# Patient Record
Sex: Female | Born: 1985 | Race: White | Hispanic: No | Marital: Single
Health system: Southern US, Community
[De-identification: ages and names within clinical notes are randomized; demographics above are authoritative.]

---

## 2008-10-25 ENCOUNTER — Ambulatory Visit: Payer: Self-pay | Admitting: Internal Medicine

## 2008-11-22 ENCOUNTER — Ambulatory Visit: Payer: Self-pay | Admitting: Internal Medicine

## 2009-03-29 ENCOUNTER — Emergency Department: Payer: Self-pay

## 2009-07-05 ENCOUNTER — Emergency Department: Payer: Self-pay | Admitting: Emergency Medicine
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
## 2010-03-17 ENCOUNTER — Ambulatory Visit: Payer: Self-pay | Admitting: Family Medicine

## 2010-08-21 ENCOUNTER — Ambulatory Visit: Payer: Self-pay | Admitting: Internal Medicine

## 2010-10-31 ENCOUNTER — Emergency Department: Payer: Self-pay | Admitting: Internal Medicine

## 2010-12-11 ENCOUNTER — Ambulatory Visit: Payer: Self-pay | Admitting: Internal Medicine

## 2011-12-25 ENCOUNTER — Ambulatory Visit: Payer: Self-pay | Admitting: Internal Medicine

## 2011-12-25 LAB — RAPID STREP-A WITH REFLX: Micro Text Report: NEGATIVE

## 2011-12-27 LAB — BETA STREP CULTURE(ARMC)

## 2012-02-06 ENCOUNTER — Ambulatory Visit: Payer: Self-pay | Admitting: Family Medicine

## 2012-03-31 ENCOUNTER — Emergency Department: Payer: Self-pay | Admitting: Emergency Medicine

## 2012-11-16 IMAGING — CR LEFT WRIST - COMPLETE 3+ VIEW
1 series · 4 of 4 positions shown · non-contrast
Comparison: none

REASON FOR EXAM: left wrist and thumb injury
COMMENTS:

PROCEDURE:     MDR - MDR WRIST LT COMP WITH OBLIQUES  - February 06, 2012  [DATE]
RESULT:     No fracture, dislocation or other acute bony abnormality is
identified.

[Series 1: pa · 0.17mm/px · 4 of 4 slices shown]
[im 1/4]
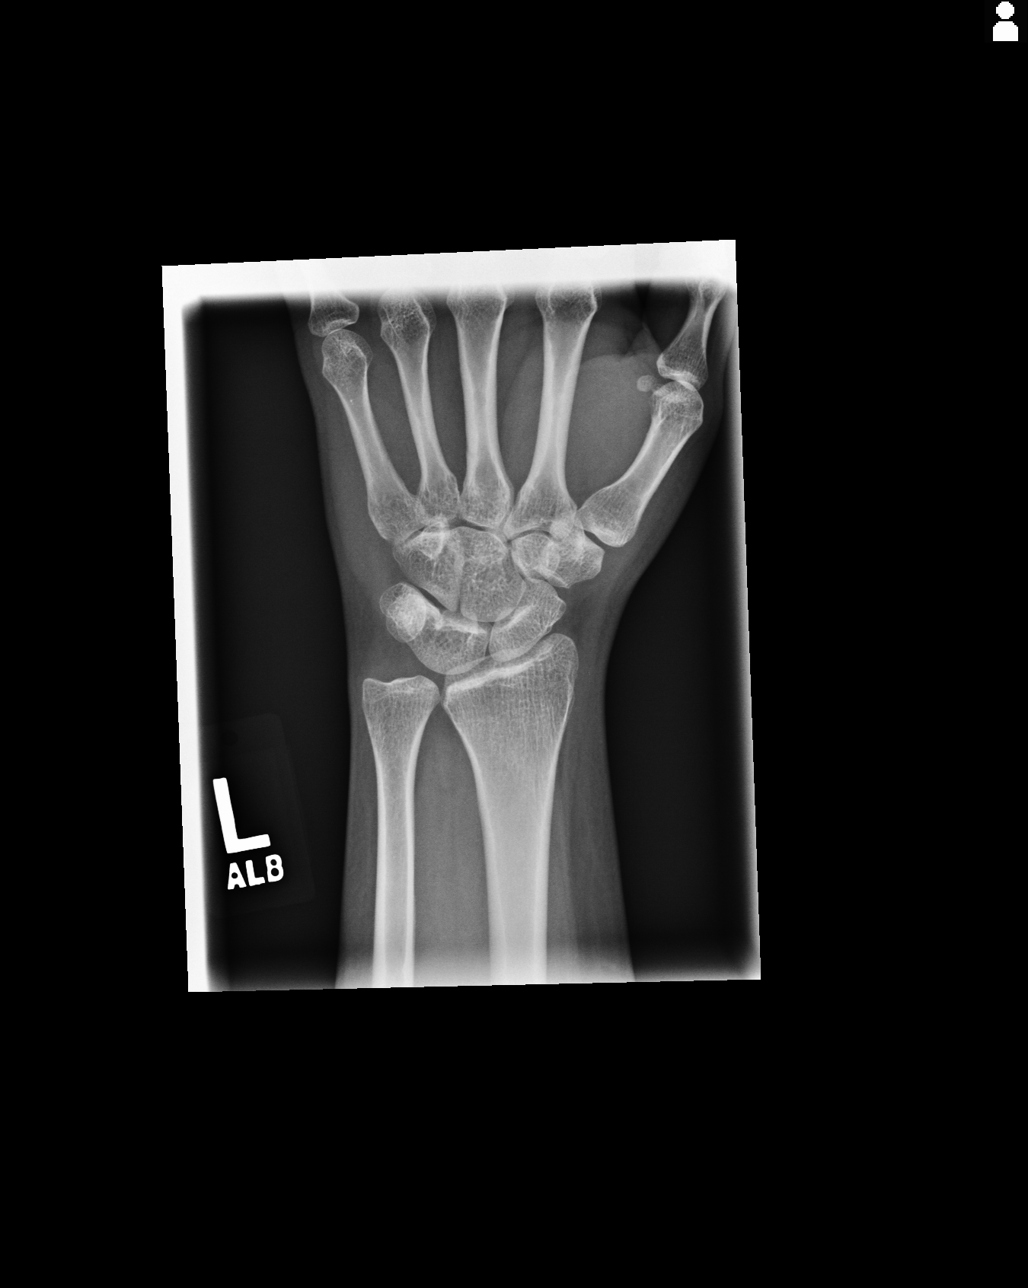
[im 2/4]
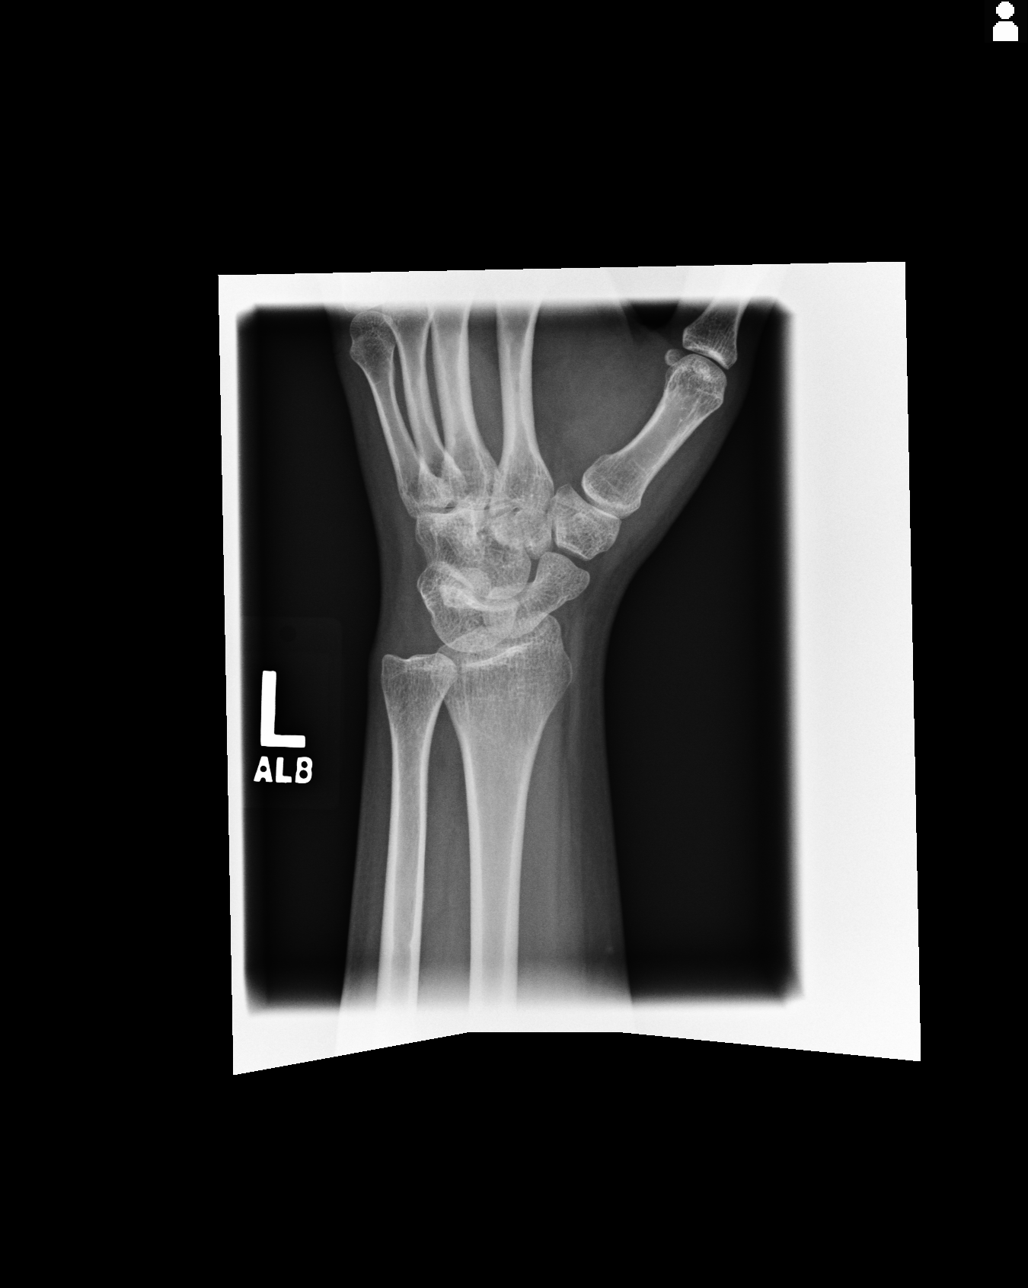
[im 3/4]
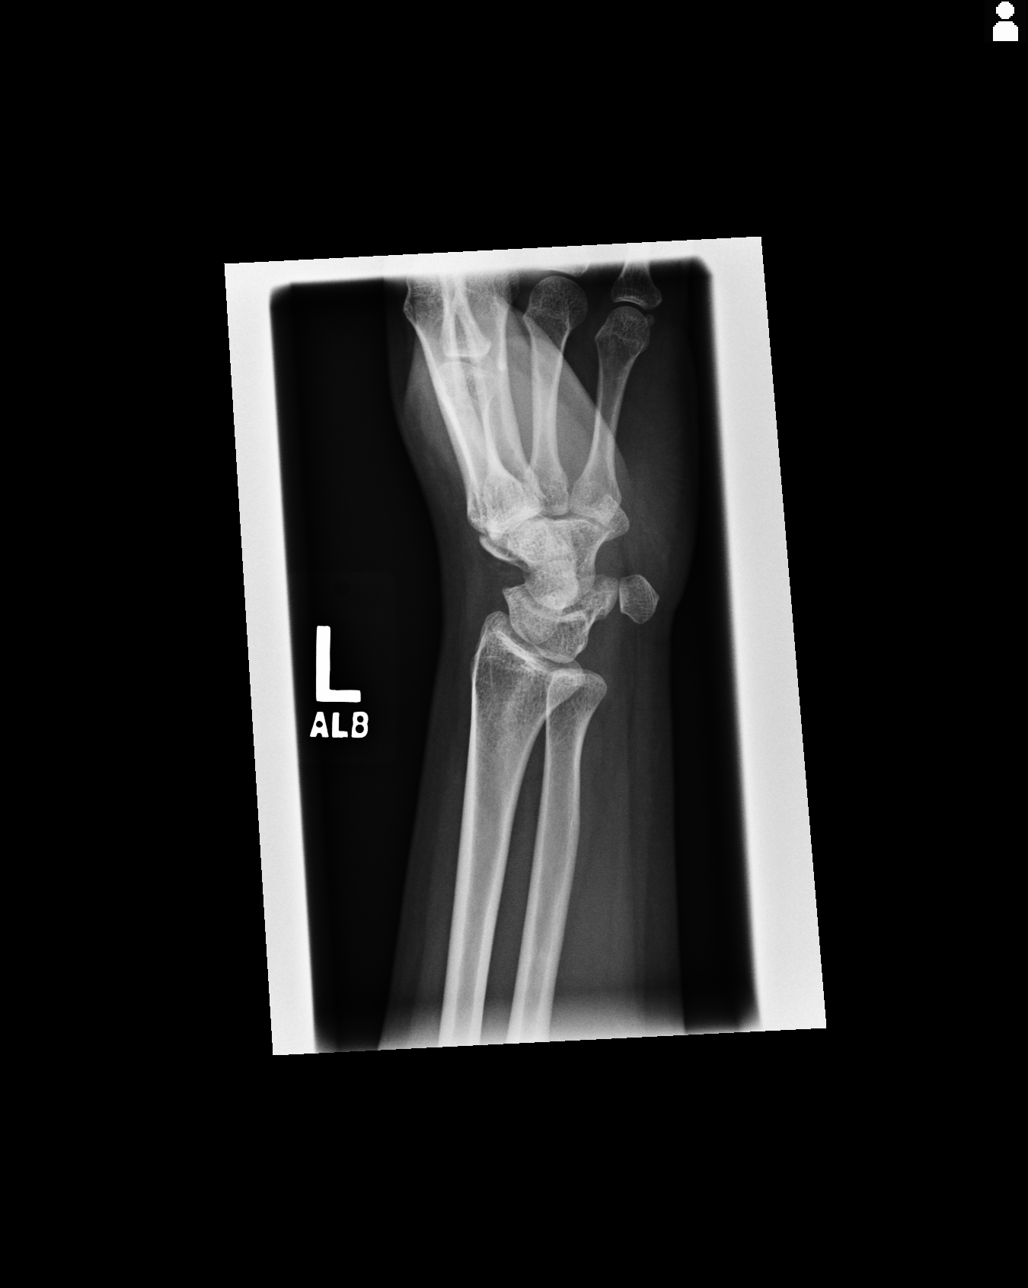
[im 4/4]
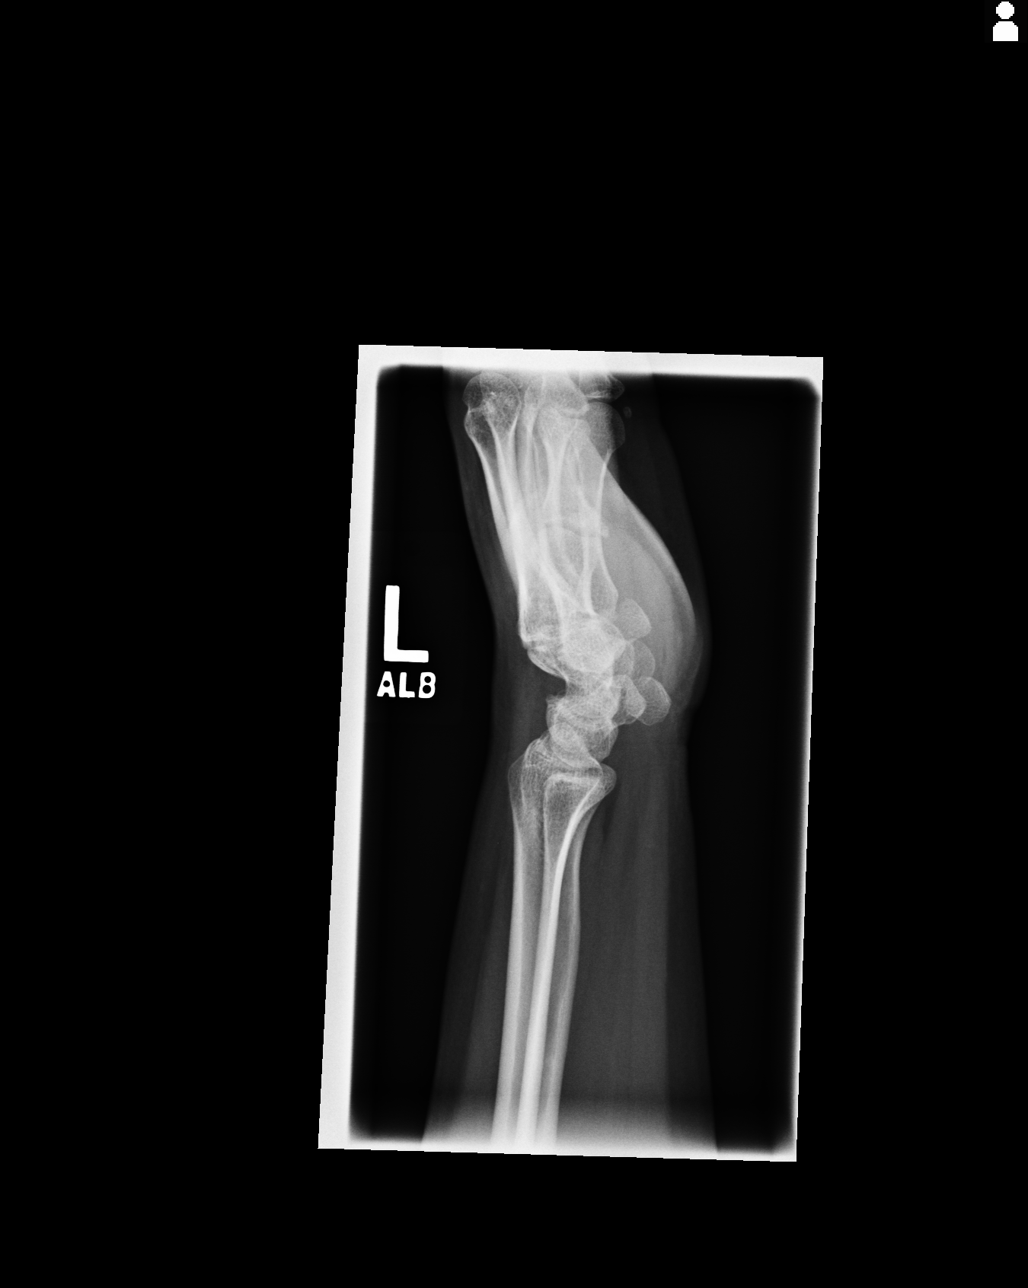

[4 of 4 positions shown; findings below may reference images not displayed]

IMPRESSION: 1.     No significant osseous abnormalities are noted.

## 2013-01-06 ENCOUNTER — Ambulatory Visit: Payer: Self-pay | Admitting: Anesthesiology

## 2013-02-10 ENCOUNTER — Ambulatory Visit: Payer: Self-pay | Admitting: Anesthesiology

## 2015-01-28 NOTE — H&P (Signed)
PATIENT NAME:  Julie Deleon, Julie Deleon MR#:  [id number] DATE OF BIRTH:  1986/08/14  DATE OF ADMISSION:  01/06/2013  CHIEF COMPLAINT: Persistent low back pain with radiation down the right posterolateral leg.   PROCEDURE: None.   HISTORY OF PRESENT ILLNESS: The patient is a pleasant 29 year old white female with a long-standing history of low back pain. She is status post decompression in September 2012. At that point she had an L5-S1 diskectomy and did well with this for several months but has had recurrence of that pain and [place]. She is currently describing a VAS of 9/10 with the best VAS of 5 and average is 6 to 7 with pain that is not influenced by time of day but worse with activity and after immobility. It is aggravated by bending, kneeling, lifting, motion, twisting, walking-type maneuvers and starts in the right mid low back radiating to the right gluteal region, right posterolateral leg going to the right foot. She has not experienced any change in bowel or bladder function or motor function. It has been severe for the past 3 to 4 months, and she has numbness affecting the right fifth toe. Cramping in her low back is worse at night with associated numbness. It is described as annoying, burning constant, pain, throbbing and tingling.  She has had a recent MRI. She did have 2 epidural steroid injections prior to her most recent surgery but nothing done interventional since that time.  The most recent MRI report was dated February 2014.  She was seen with evidence of a recurrent L5-S1 disk protrusion into the right lateral recess with compression of the S1 nerve root and neuroforaminal narrowing.    PAST MEDICAL HISTORY:  She is a smoker, otherwise, denies other medical issues.    SOCIAL HISTORY:  She is single, smokes a half pack of cigarettes a day.  She was previously married.  Has 1 child.  She works full-time as a Sales promotion account executivetruck service advisor.  CURRENT MEDICATIONS:   Include Aleve taking at least 220 mg 3 orally 4 times a day as needed for pain.  ALLERGIES:  She is allergic to [facility].   PHYSICAL EXAMINATION:  VITAL SIGNS:  VAS is 7/10, temperature 98.2, blood pressure 144/79, pulse   and regular, respirations 16. GENERAL:  Reveals a pleasant, white female in no acute distress.  She is alert and oriented x 3, cooperative and compliant.   HEENT:  Pupils were equally round and reactive to light.  Extraocular muscles intact. HEART:  Regular rate and rhythm.  LUNGS:  Clear to auscultation. EXTREMITIES:  Inspection of the lower extremities reveals strength to be well preserved to the lower extremities.  She is slightly weak with flexion of the right knee as compared to the left with some mild pain with extension at the right lower back with right lateral rotation as compared to left lateral rotation.  There is some mild paraspinous muscle   tenderness.  She appears to have a positive straight leg raise on the right side.  ASSESSMENT:  A 29 year old white female with chronic low back pain status post right L5-S1 lumbar diskectomy with recurrent S1 radiculitis.  PLAN:  I have had a long discussion with the patient regarding her care and I think she would be a good candidate for an epidural steroid injection to see if we can help with the radicular symptoms that she is experiencing.  At this point, she does not want to proceed with  any type of interventional therapy.  This would include epidural steroid injections or repeat surgery as advised by Dr. Jaynie Collins. I have tried to allay some of her concerns regarding epidural steroid injections and she will consider this.    She would like to be on some medication management for this; I think this is acceptable; however, any opioids make her exceedingly sedated during work, so we have talked about starting her on Ultram 50 mg tablets, 1 by mouth b.i.d. with 1 tablet at bedtime and 1 tablet when she gets home from work to see  if this can be well tolerated. She can increase to t.i.d. dosing as indicated or necessary. We have gone over the risks and benefits of the medication in detail. I will have her return to the clinic in approximately 1 month for re-evaluation.     ____________________________ Currie Paris Pernell Dupre, MD jga:ct D: 01/09/2013 11:42:12 ET T: 01/09/2013 11:59:22 ET JOB#: [id number]  cc: Currie Paris. Pernell Dupre, MD, <Dictator> Dr. Jaynie Collins, [facility]   Yevette Edwards MD ELECTRONICALLY SIGNED 01/12/2013 7:25
